# Patient Record
Sex: Male | Born: 1965 | Race: White | Hispanic: No | Marital: Married
Health system: Southern US, Community
[De-identification: ages and names within clinical notes are randomized; demographics above are authoritative.]

---

## 2004-09-13 ENCOUNTER — Emergency Department: Payer: Self-pay | Admitting: Emergency Medicine

## 2005-02-11 ENCOUNTER — Emergency Department: Payer: Self-pay | Admitting: General Practice

## 2005-02-11 ENCOUNTER — Other Ambulatory Visit: Payer: Self-pay

## 2006-11-29 ENCOUNTER — Emergency Department: Payer: Self-pay | Admitting: Emergency Medicine

## 2007-10-02 ENCOUNTER — Other Ambulatory Visit: Payer: Self-pay

## 2007-10-02 ENCOUNTER — Emergency Department: Payer: Self-pay | Admitting: Emergency Medicine

## 2008-06-03 ENCOUNTER — Emergency Department (HOSPITAL_COMMUNITY): Admission: EM | Admit: 2008-06-03 | Discharge: 2008-06-03 | Payer: Self-pay | Admitting: Emergency Medicine

## 2008-06-17 IMAGING — CT CT HEAD WITHOUT CONTRAST
2 series · 16 of 30 positions shown, 20 images · non-contrast
Comparison: none

REASON FOR EXAM: vertigo
COMMENTS:

PROCEDURE:     CT  - CT HEAD WITHOUT CONTRAST  - October 02, 2007  [DATE]
RESULT:     Comparison: Head CT on 02/11/2005.
Procedure: CT examination of the head was performed without intravenous
contrast. Collimation is 5 mm.

[Series 2: without · axial · non-contrast · 0.39mm/px · z∈[+1144,+1264]mm · 13 of 30 slices shown, 17 images]
[im 3/30  brain]
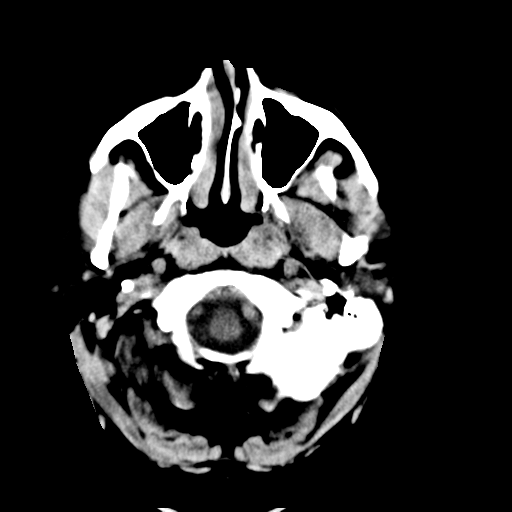
[im 3/30  bone]
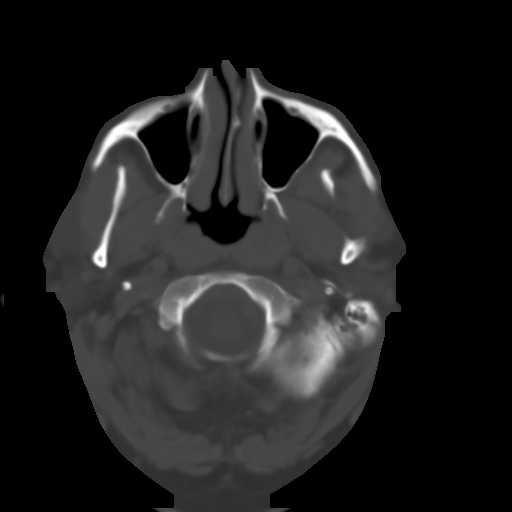
[im 5/30  brain]
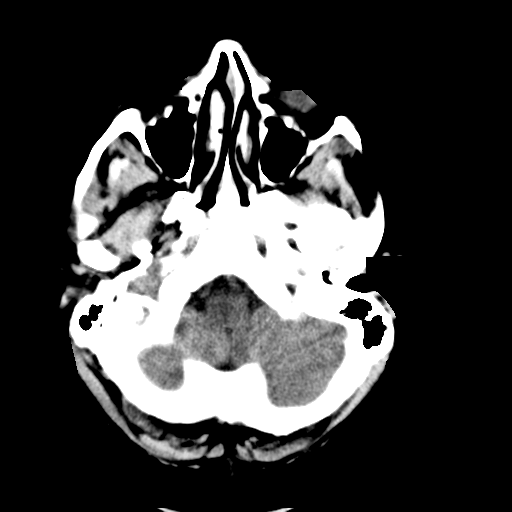
[im 7/30  brain]
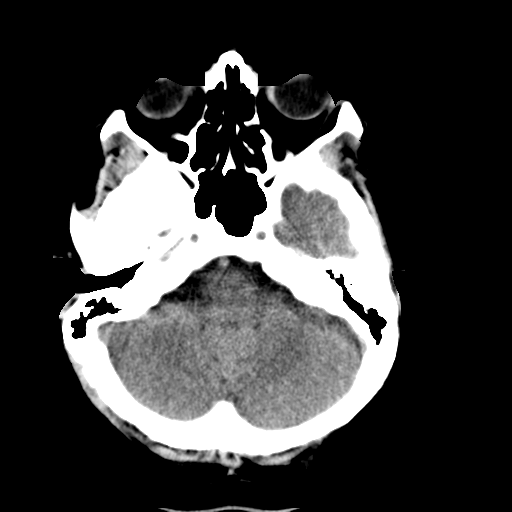
[im 9/30  brain]
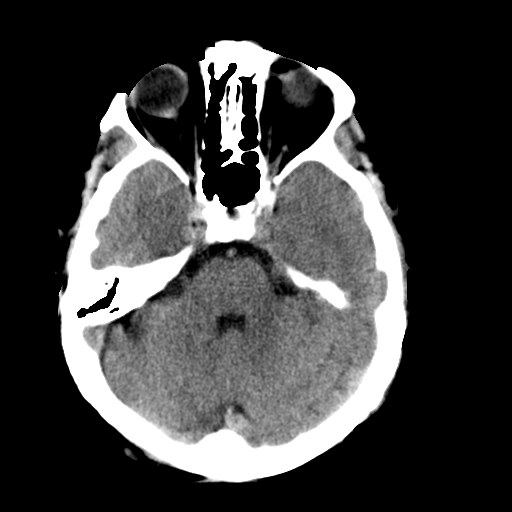
[im 11/30  brain]
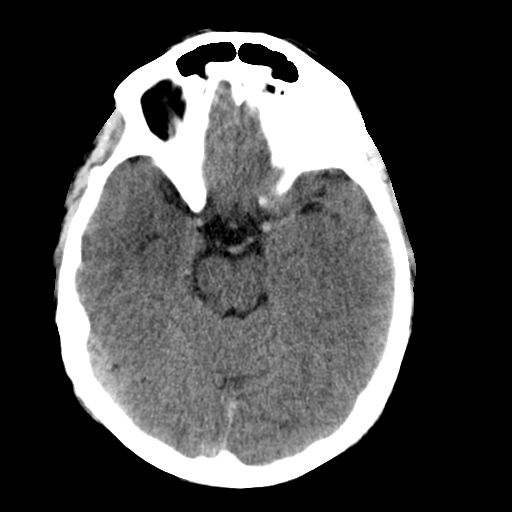
[im 11/30  bone]
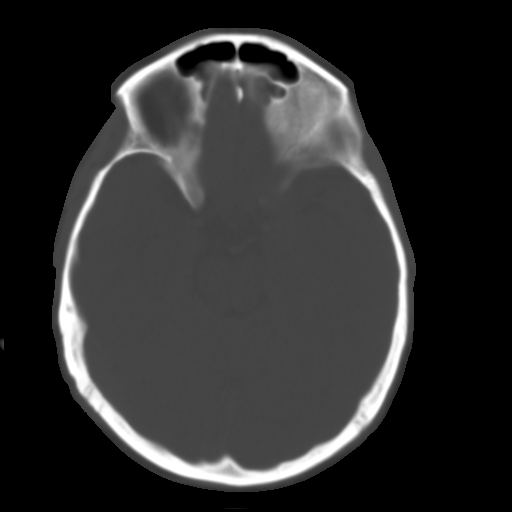
[im 13/30  brain]
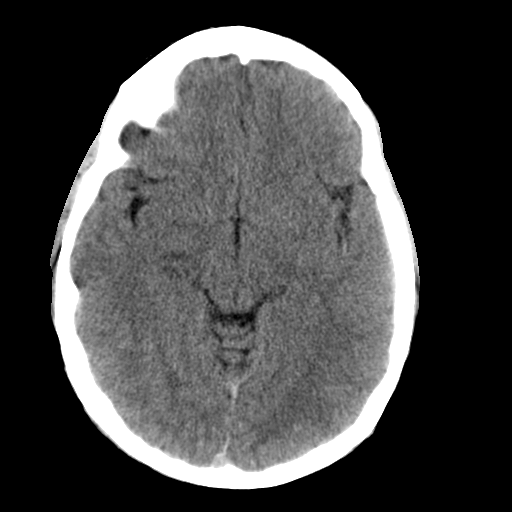
[im 15/30  brain]
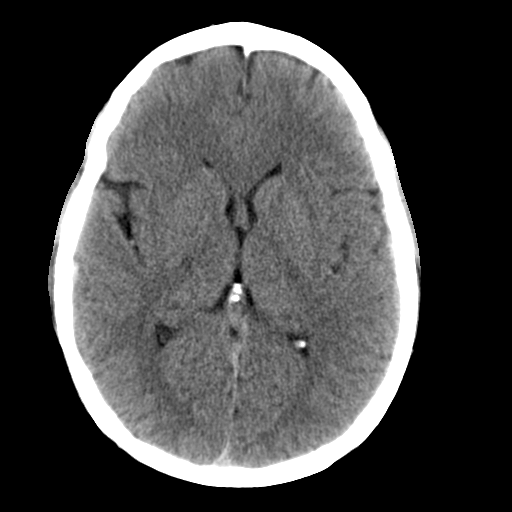
[im 17/30  brain]
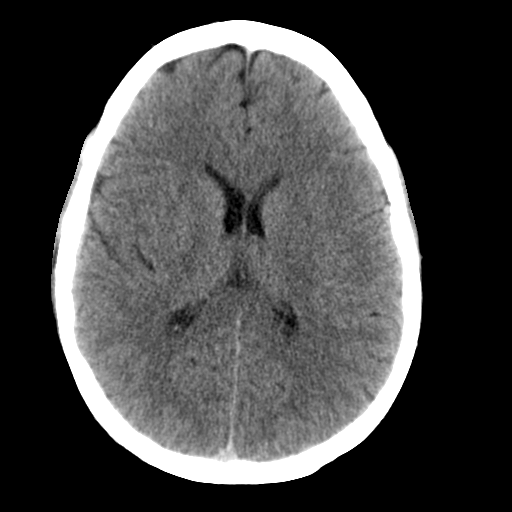
[im 19/30  brain]
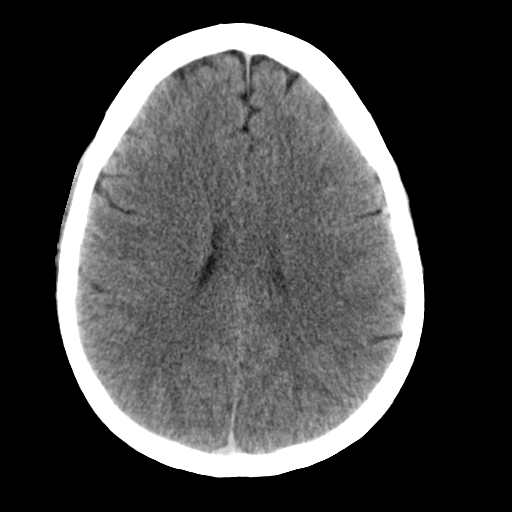
[im 19/30  bone]
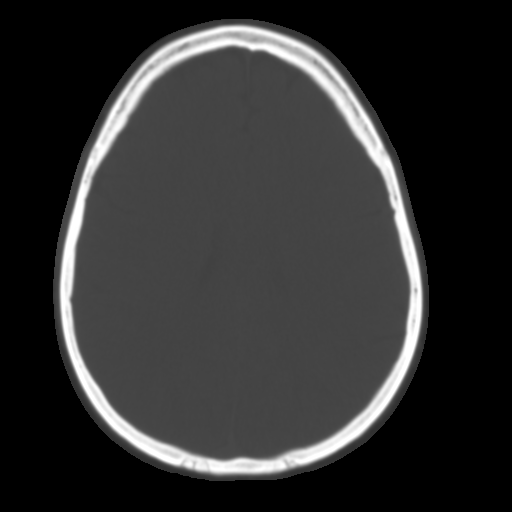
[im 21/30  brain]
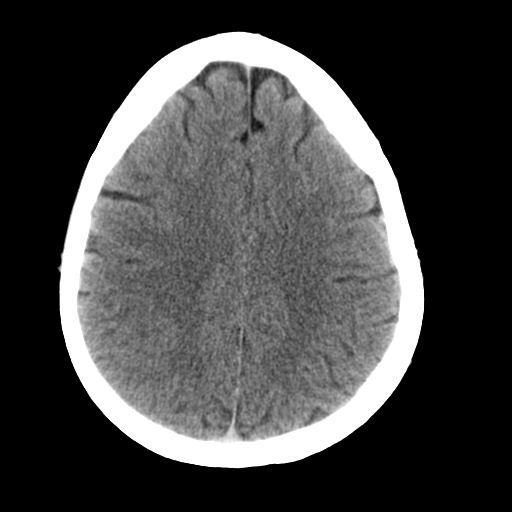
[im 23/30  brain]
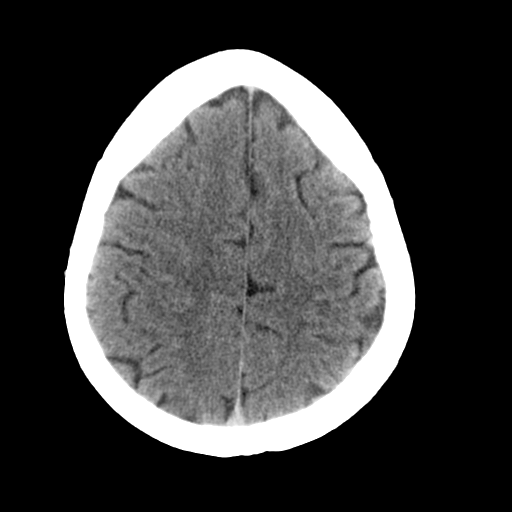
[im 25/30  brain]
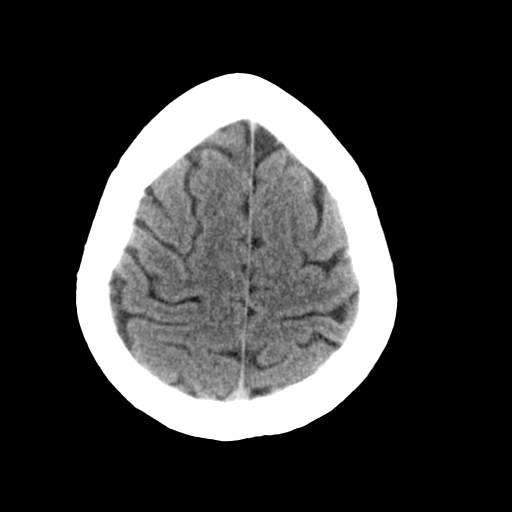
[im 27/30  brain]
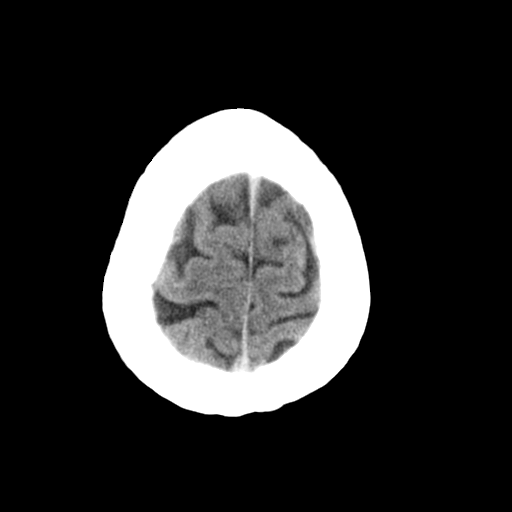
[im 27/30  bone]
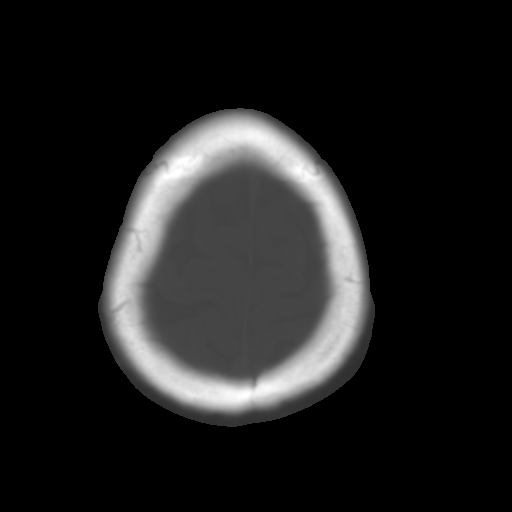

[Series 3: bone · axial · 0.39mm/px · z∈[+1144,+1184]mm · 3 of 30 slices shown]
[im 3/30  bone]
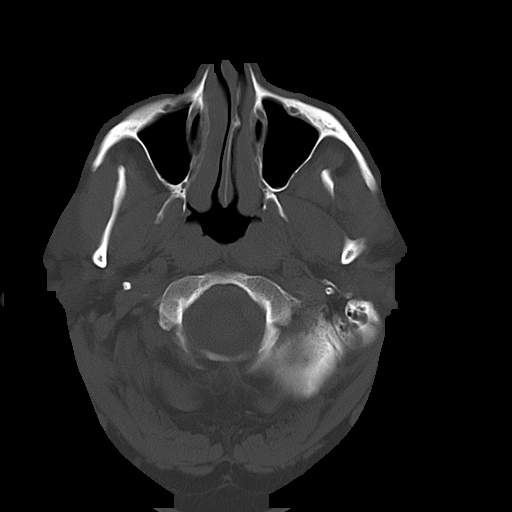
[im 7/30  bone]
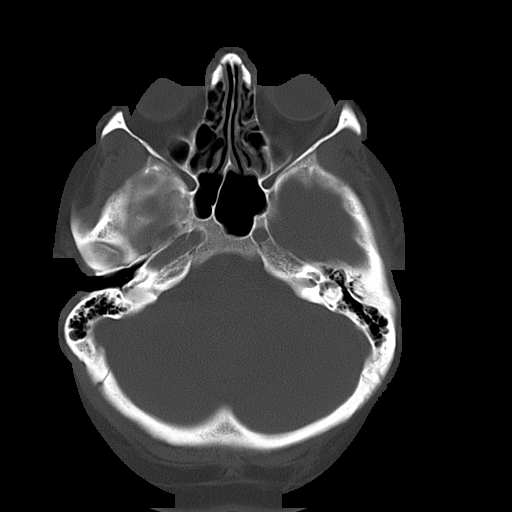
[im 11/30  bone]
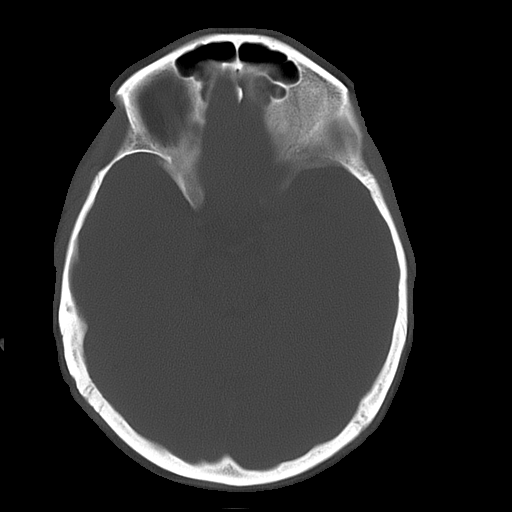

[16 of 30 positions shown; findings below may reference images not displayed]

FINDINGS: No evidence of intracranial hemorrhage, mass-effect, or ventricular
dilatation. Redemonstration of tiny focus of low attenuation in the white
matter inferior to the right basal ganglia, stable compare to the previous
exam. Again it may represent a prominent perivascular space or a
neuroepithelial cyst. The gray and white matters are otherwise
differentiated. No displaced calvarial fracture is noted. The partially
visualized paranasal sinuses and mastoid air cells are unremarkable.
IMPRESSION: 1. No acute intracranial abnormality is noted.

## 2010-04-30 ENCOUNTER — Emergency Department: Payer: Self-pay | Admitting: Emergency Medicine

## 2011-05-20 LAB — GLUCOSE, CAPILLARY: Glucose-Capillary: 155 — ABNORMAL HIGH

## 2011-10-08 ENCOUNTER — Emergency Department: Payer: Self-pay | Admitting: Emergency Medicine

## 2012-08-29 ENCOUNTER — Inpatient Hospital Stay: Payer: Self-pay | Admitting: Surgery

## 2012-08-29 LAB — URINALYSIS, COMPLETE
Glucose,UR: NEGATIVE mg/dL (ref 0–75)
Leukocyte Esterase: NEGATIVE
Ph: 5 (ref 4.5–8.0)
Protein: NEGATIVE
WBC UR: 1 /HPF (ref 0–5)

## 2012-08-29 LAB — COMPREHENSIVE METABOLIC PANEL
Albumin: 4 g/dL (ref 3.4–5.0)
Alkaline Phosphatase: 164 U/L — ABNORMAL HIGH (ref 50–136)
Anion Gap: 7 (ref 7–16)
Calcium, Total: 9 mg/dL (ref 8.5–10.1)
Creatinine: 1.2 mg/dL (ref 0.60–1.30)
Glucose: 125 mg/dL — ABNORMAL HIGH (ref 65–99)
Osmolality: 278 (ref 275–301)
Potassium: 4.3 mmol/L (ref 3.5–5.1)
SGOT(AST): 116 U/L — ABNORMAL HIGH (ref 15–37)

## 2012-08-29 LAB — CBC
HCT: 46.9 % (ref 40.0–52.0)
HGB: 15.6 g/dL (ref 13.0–18.0)
MCH: 28.6 pg (ref 26.0–34.0)
MCHC: 33.2 g/dL (ref 32.0–36.0)

## 2012-08-29 LAB — LIPASE, BLOOD: Lipase: 223 U/L (ref 73–393)

## 2012-08-30 LAB — CBC WITH DIFFERENTIAL/PLATELET
Basophil #: 0 10*3/uL (ref 0.0–0.1)
Basophil %: 0.6 %
Eosinophil %: 3.7 %
HCT: 43.4 % (ref 40.0–52.0)
MCH: 29 pg (ref 26.0–34.0)
MCHC: 33.6 g/dL (ref 32.0–36.0)
MCV: 86 fL (ref 80–100)
Monocyte #: 0.5 x10 3/mm (ref 0.2–1.0)
Neutrophil #: 4.7 10*3/uL (ref 1.4–6.5)
RDW: 14.6 % — ABNORMAL HIGH (ref 11.5–14.5)

## 2012-08-30 LAB — HEPATIC FUNCTION PANEL A (ARMC)
Albumin: 3.3 g/dL — ABNORMAL LOW (ref 3.4–5.0)
Bilirubin, Direct: 0.2 mg/dL (ref 0.00–0.20)
SGPT (ALT): 456 U/L — ABNORMAL HIGH (ref 12–78)

## 2012-08-30 LAB — BASIC METABOLIC PANEL
Anion Gap: 4 — ABNORMAL LOW (ref 7–16)
Creatinine: 1.14 mg/dL (ref 0.60–1.30)
Glucose: 105 mg/dL — ABNORMAL HIGH (ref 65–99)
Osmolality: 277 (ref 275–301)
Sodium: 139 mmol/L (ref 136–145)

## 2012-09-01 LAB — PATHOLOGY REPORT

## 2013-05-15 IMAGING — US ABDOMEN ULTRASOUND LIMITED
1 series · 14 of 25 positions shown · non-contrast
Comparison: none

REASON FOR EXAM: epig abd pain w/ elevated transaminases
COMMENTS:   Body Site: GB and Fossa, CBD, Head of Pancreas

[Series 1: abdomen ultrasound limited · 0.31mm/px · 14 of 60 slices shown]
[im 1/60]
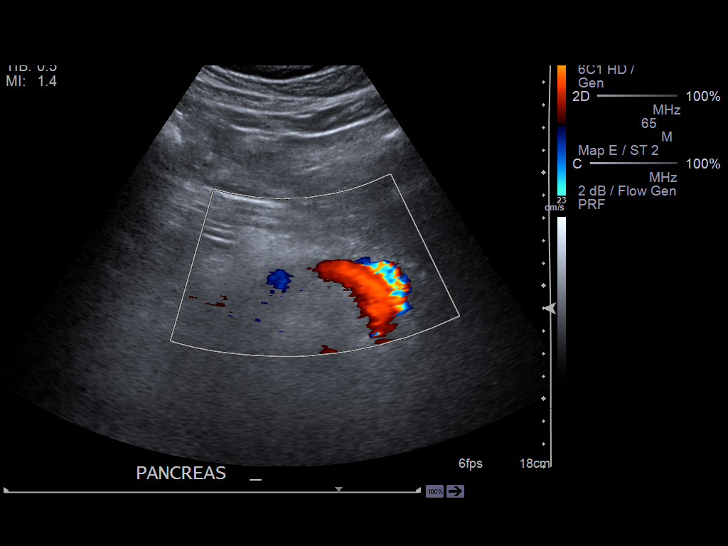
[im 5/60]
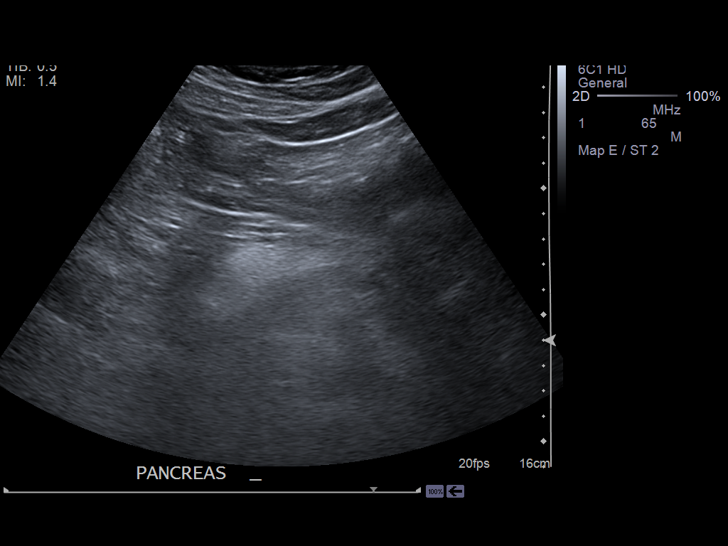
[im 10/60]
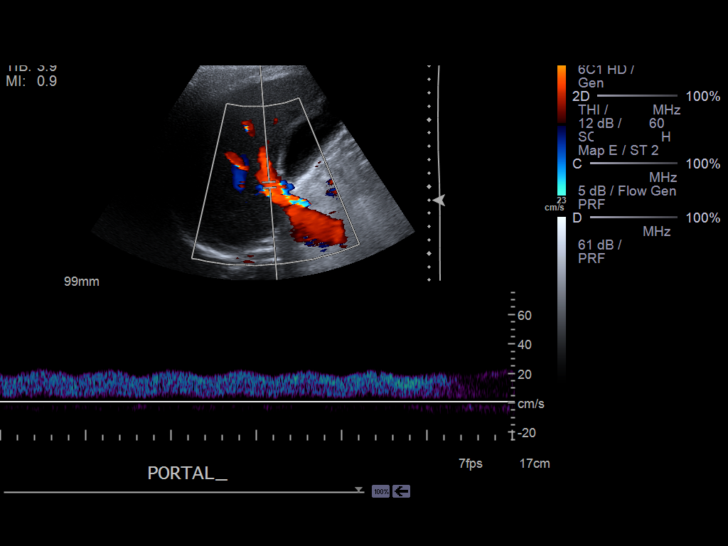
[im 15/60]
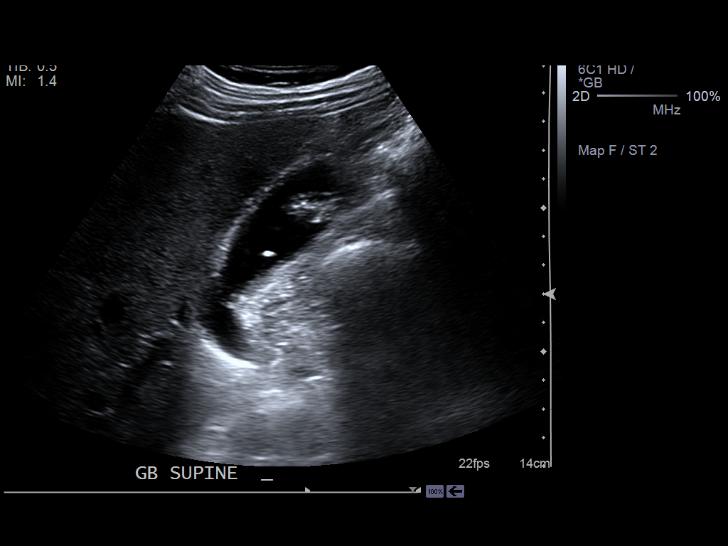
[im 20/60]
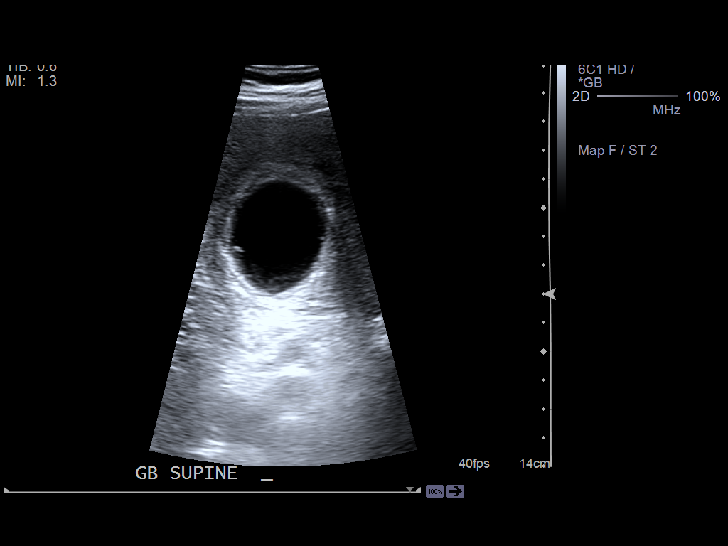
[im 23/60]
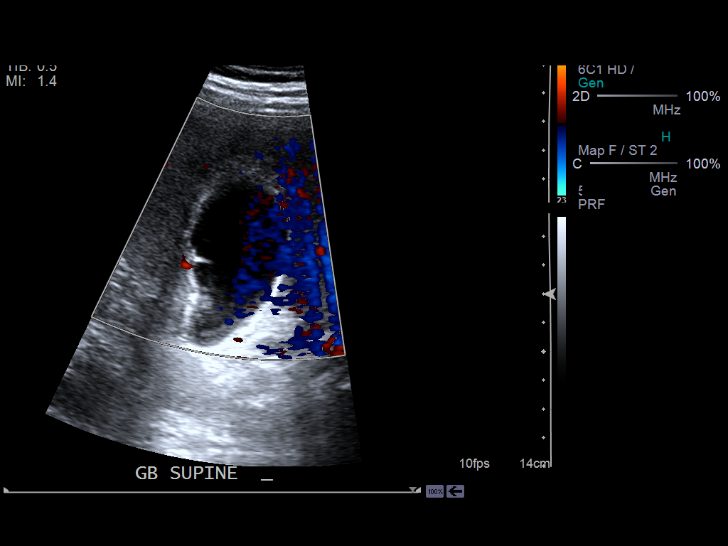
[im 28/60]
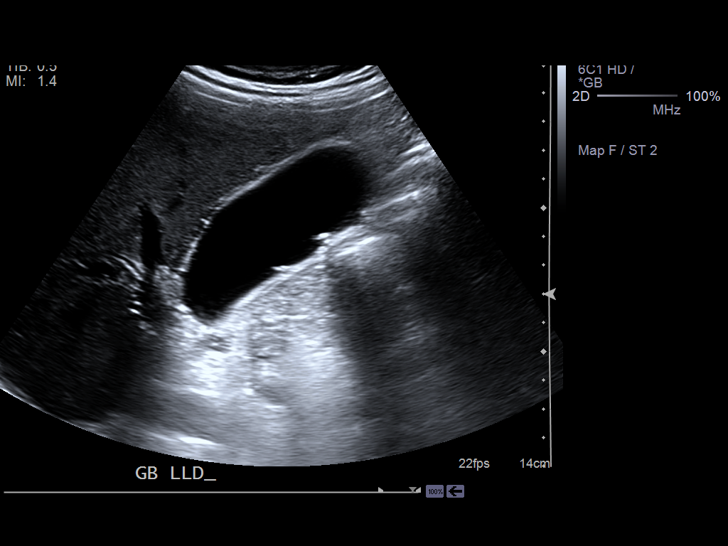
[im 32/60]
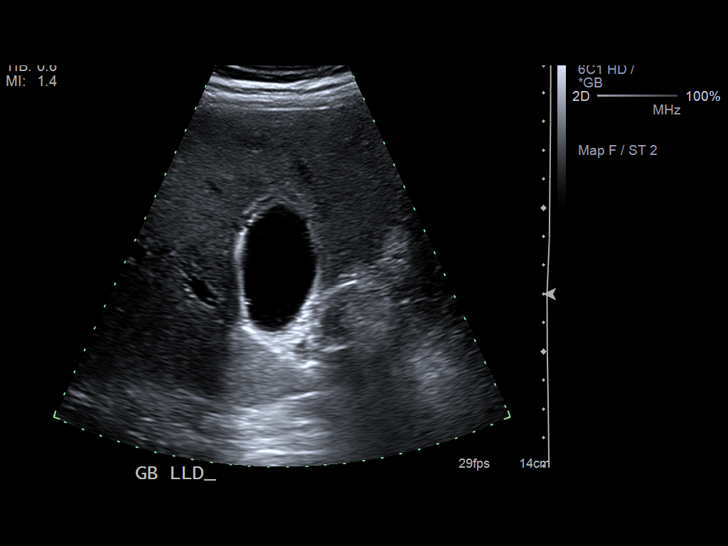
[im 37/60]
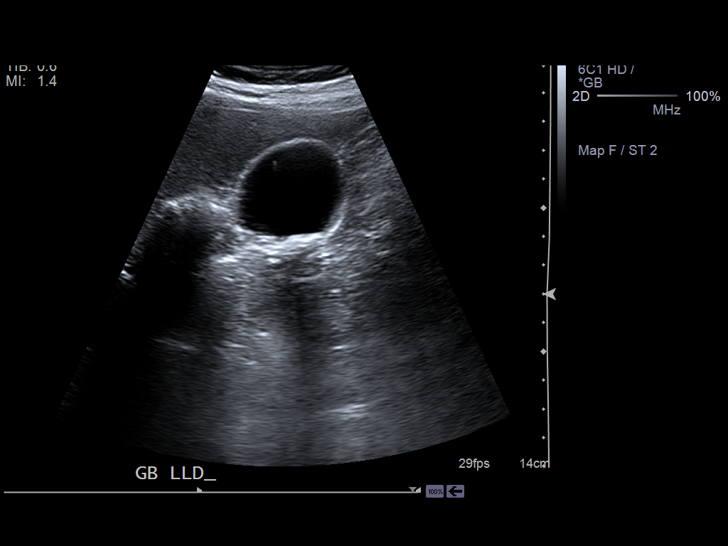
[im 40/60]
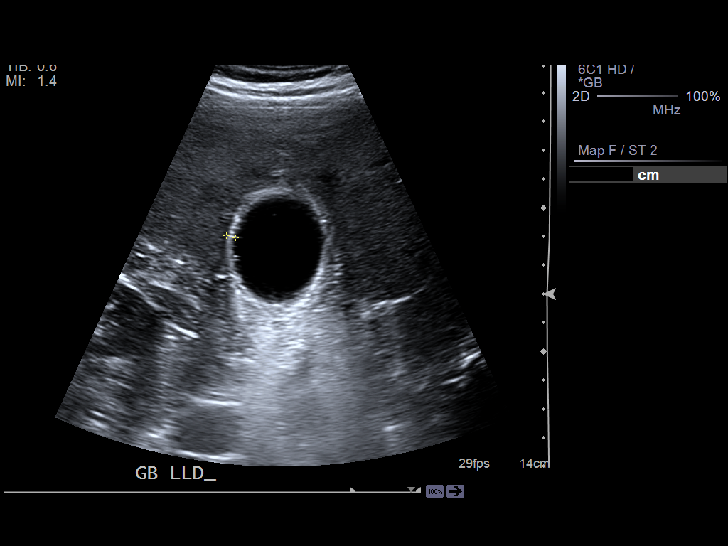
[im 45/60]
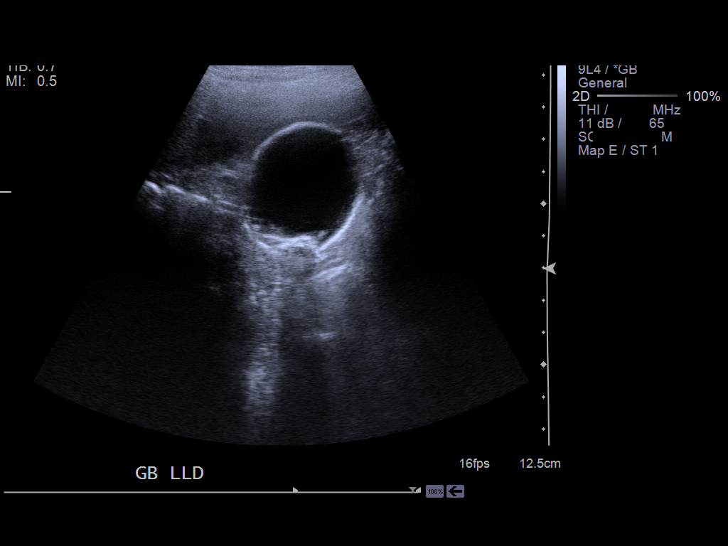
[im 50/60]
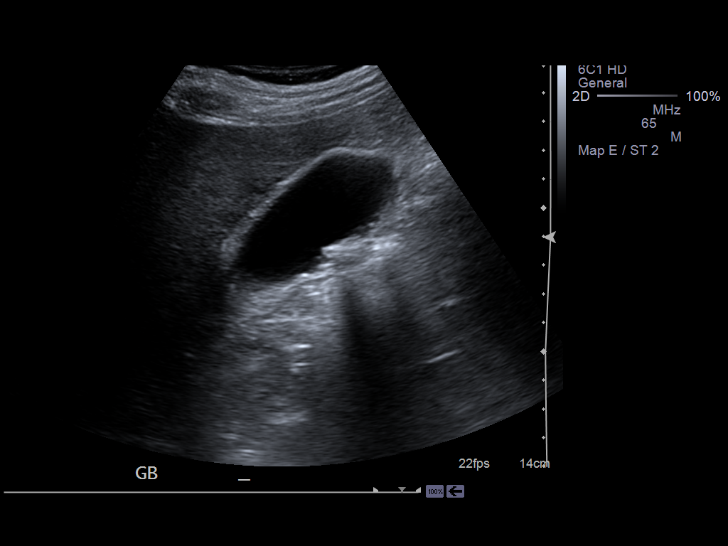
[im 55/60]
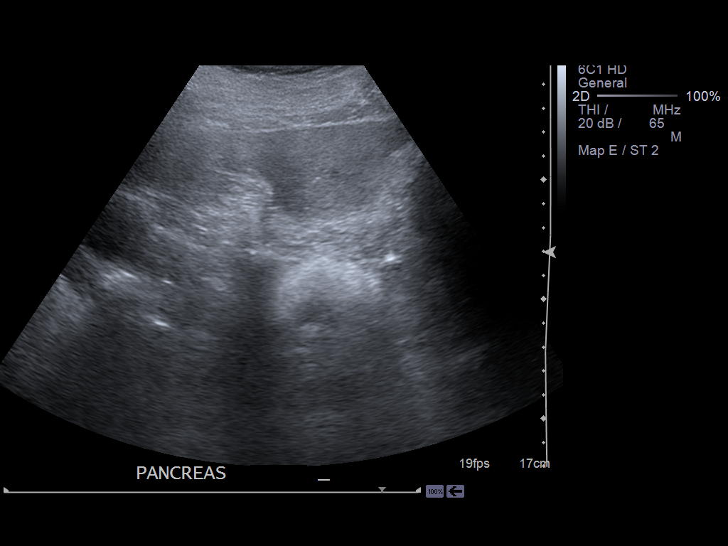
[im 60/60]
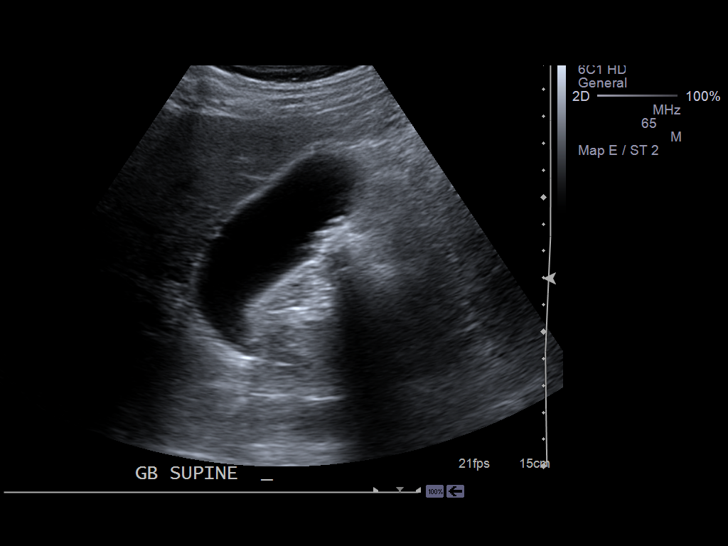

[14 of 25 positions shown; findings below may reference images not displayed]

PROCEDURE:     US  - US ABDOMEN LIMITED SURVEY  - August 29, 2012  [DATE]

RESULT:     A limited right upper quadrant ultrasound was performed.

The gallbladder is adequately distended and  contains echogenic mobile
stones. There is no positive sonographic Murphy's sign. There is mild
gallbladder wall thickening to 5.2 mm. There is a trace of pericholecystic
fluid.

The common bile duct is normal at approximately 4 mm in diameter. The
observed portions of the liver exhibit no focal masses or ductal dilation.
Portal venous flow is normal in direction toward the liver. Evaluation of
pancreas was limited by bowel gas.
IMPRESSION: 1. There are multiple gallstones present. There is gallbladder wall
thickening and a small amount of pericholecystic fluid. There is no positive
sonographic Murphy's sign.
2. The observed portions of the liver appear normal. The common bile duct is
not dilated.
3. Evaluation of the pancreas was limited by bowel gas.

[REDACTED]

## 2014-12-08 NOTE — H&P (Signed)
PATIENT NAME:  William Casey, William Casey MR#:  [id number] DATE OF BIRTH:  [birth year]  DATE OF ADMISSION:  08/29/2012  PRIMARY CARE PHYSICIAN:  None.   ADMITTING PHYSICIAN:  Dr. Michela PitcherEly.   CHIEF COMPLAINT:  Abdominal pain and nausea.   BRIEF HISTORY:  The patient is a 49 year old gentleman seen in the Emergency Room with an 18 to 24-hour history of abdominal pain. The pain is primarily midepigastric/right upper quadrant without radiation. Sudden onset of pain developed yesterday. He has had intermittent previous episodes, although not as severe, again primarily right upper quadrant/midepigastric discomfort. It is not associated with eating as far as he can tell. He is nauseated, but does not vomit. He denies any fever or chills. He has not had any other change in his bowel habits. He has never had any previous workup. He denies any history of hepatitis, yellow jaundice, pancreatitis, peptic ulcer disease, previous diagnosis of gallbladder disease or diverticulitis. He has no history of cardiac disease, hypertension, diabetes or hypothyroidism. He does have a history of hyperlipidemia.   MEDICATIONS:  He takes no medications regularly.   ALLERGIES:  He has no medical allergies.   SOCIAL HISTORY:  He is not a cigarette smoker, does not drink alcohol regularly. 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   REVIEW OF SYSTEMS:  A 10-point review of systems is unremarkable.   FAMILY HISTORY:  Noncontributory.   PHYSICAL EXAMINATION: GENERAL: He is an alert, pleasant gentleman in no significant distress.  VITAL SIGNS: Blood pressure 140/88, pulse rate is 86, respirations 18, pain scale is an 8.  HEENT: No scleral icterus. No facial deformities. No pupillary abnormalities.  NECK: Supple without adenopathy and tenderness. Trachea is midline.  CHEST: Clear with no adventitious sounds. He has normal pulmonary excursion.  CARDIAC: There were no murmurs or gallops to my ear. He seems to be in normal sinus rhythm.  ABDOMEN:  Soft, nontender with no organomegaly. He does have some mild midepigastric/right upper quadrant tenderness with no guarding. No rebound. No masses. No hernias noted.  EXTREMITIES: Full range of motion. No deformities. Normal distal pulses.   IMPRESSION:  This gentleman appears to have significant biliary tract disease by his ultrasound. He has some mild pericholecystic fluid, gallbladder wall thickening and multiple stones. The ultrasound findings are consistent with acute cholecystitis. White blood is 12,000. Liver function studies are largely unremarkable.   IMPRESSION:  This gentleman would benefit from antibiotic therapy, IV hydration, admission and further evaluation. We will anticipate surgical intervention within the next 24 hours.   This plan has been discussed with the patient in detail and he is in agreement.    ____________________________ Carmie Endalph L. Ely III, MD rle:si D: 08/29/2012 15:41:54 ET T: 08/29/2012 17:55:43 ET JOB#: [id number]  cc: Quentin Orealph L. Ely III, MD, <Dictator> Quentin OreALPH L ELY MD ELECTRONICALLY SIGNED 09/03/2012 8:48

## 2014-12-08 NOTE — Op Note (Signed)
PATIENT NAME:  William Casey, William Casey MR#:  [id number] DATE OF BIRTH:  Jan 10, 1966  DATE OF PROCEDURE:  08/30/2012  PREOPERATIVE DIAGNOSIS: Acute cholecystitis.  POSTOPERATIVE DIAGNOSIS: Acute cholecystitis, early.  PROCEDURE PERFORMED: Laparoscopic cholecystectomy.  SURGEON: Claude MangesWilliam F. Josceline Chenard, M.D.   ANESTHESIA: General.   PROCEDURE IN DETAIL: The patient was placed supine, on the operating room table, and prepped and draped in the usual sterile fashion. A 15 mmHg CO2 pneumoperitoneum was created via a Veress needle, in the infraumbilical position, and this was replaced with a 5 mm trocar and 25 degree angled scope. The remaining trocars were placed under direct visualization. The fundus of the gallbladder was retracted superiorly and ventrally and the infundibulum of the gallbladder was retracted laterally opening up the triangle of Calot. There were no adhesions, but there was some edema to the gallbladder wall. Dissection in the triangle of Calot revealed a clear cystic duct/gallbladder junction and the cystic duct was doubly clipped and divided. A similar procedure was performed with the cystic artery and the gallbladder was removed from the liver bed with electrocautery. It was placed in an Endo Catch bag and extracted from the abdomen via the epigastric port. This port site fascia was closed with the laparoscopic puncture closure device and a single 0 Vicryl suture. The right upper quadrant was irrigated with warm normal saline. This was suctioned clear, hemostasis was excellent, there was no evidence of bile leak and the clips were secure. Therefore, the peritoneum was desufflated and decannulated and all four skin sites were closed with subcuticular 5-0 Monocryl and suture strips. The patient tolerated the procedure well. There were no complications. ____________________________ Claude MangesWilliam F. Zaion Hreha, MD wfm:sb D: 08/30/2012 12:44:23 ET     T: 08/30/2012 12:50:39 ET        JOB#: [id number] cc: Claude MangesWilliam  F. Cheryln Balcom, MD, <Dictator> Claude MangesWILLIAM F Lizzett Nobile MD ELECTRONICALLY SIGNED 09/01/2012 9:04

## 2015-05-19 DEATH — deceased
